# Patient Record
Sex: Male | Born: 1950 | Race: White | Hispanic: No | Marital: Married | Smoking: Never smoker
Health system: Southern US, Community
[De-identification: ages and names within clinical notes are randomized; demographics above are authoritative.]

## PROBLEM LIST (undated history)

## (undated) DIAGNOSIS — E7800 Pure hypercholesterolemia, unspecified: Secondary | ICD-10-CM

## (undated) DIAGNOSIS — Z86010 Personal history of colonic polyps: Secondary | ICD-10-CM

## (undated) DIAGNOSIS — Z800 Family history of malignant neoplasm of digestive organs: Secondary | ICD-10-CM

## (undated) DIAGNOSIS — K449 Diaphragmatic hernia without obstruction or gangrene: Secondary | ICD-10-CM

## (undated) HISTORY — PX: FOOT SURGERY: SHX648

## (undated) HISTORY — DX: Diaphragmatic hernia without obstruction or gangrene: K44.9

## (undated) HISTORY — DX: Pure hypercholesterolemia, unspecified: E78.00

## (undated) HISTORY — DX: Family history of malignant neoplasm of digestive organs: Z80.0

## (undated) HISTORY — DX: Personal history of colonic polyps: Z86.010

---

## 1983-04-25 HISTORY — PX: BACK SURGERY: SHX140

## 2004-01-16 ENCOUNTER — Emergency Department (HOSPITAL_COMMUNITY): Admission: EM | Admit: 2004-01-16 | Discharge: 2004-01-16 | Payer: Self-pay | Admitting: Emergency Medicine

## 2004-05-19 ENCOUNTER — Ambulatory Visit: Payer: Self-pay | Admitting: Gastroenterology

## 2004-05-20 ENCOUNTER — Ambulatory Visit: Payer: Self-pay | Admitting: Gastroenterology

## 2004-10-11 ENCOUNTER — Ambulatory Visit (HOSPITAL_BASED_OUTPATIENT_CLINIC_OR_DEPARTMENT_OTHER): Admission: RE | Admit: 2004-10-11 | Discharge: 2004-10-11 | Payer: Self-pay | Admitting: Family Medicine

## 2004-10-16 ENCOUNTER — Ambulatory Visit: Payer: Self-pay | Admitting: Internal Medicine

## 2006-04-25 IMAGING — CR DG FINGER MIDDLE 2+V*L*
1 series · 2 of 2 positions shown · non-contrast
Comparison: none

CLINICAL DATA: Cut third finger with Alessandro clippers.
 LEFT THIRD FINGER
 Three views of the left third finger were obtained.  There is soft tissue defect distally.  There is slight irregularity to the cortex of the tuft of the distal phalanx, most consistent with nondisplaced fracture.
 IMPRESSION
 Probable nondisplaced fracture of the tuft of the distal phalanx of the left third digit.

[Series 1001: view not recorded · 0.10mm/px · 2 of 2 slices shown]
[im 1/2]
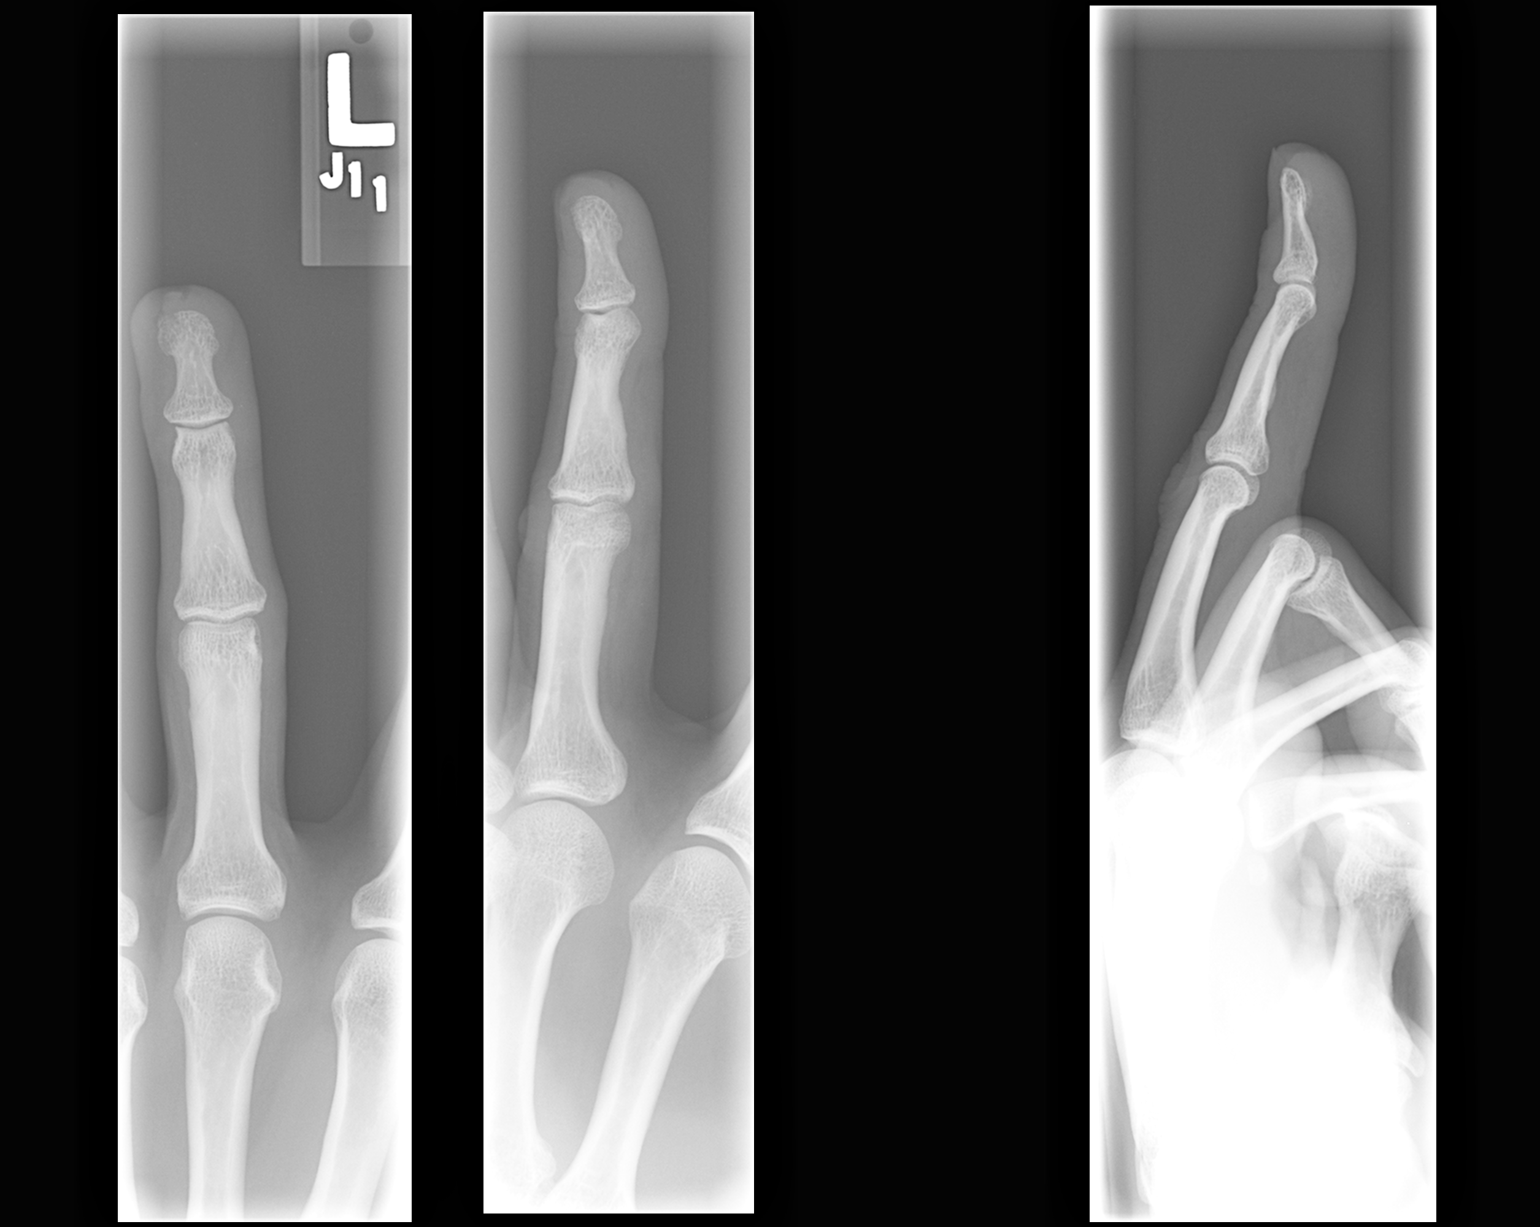
[im 2/2]
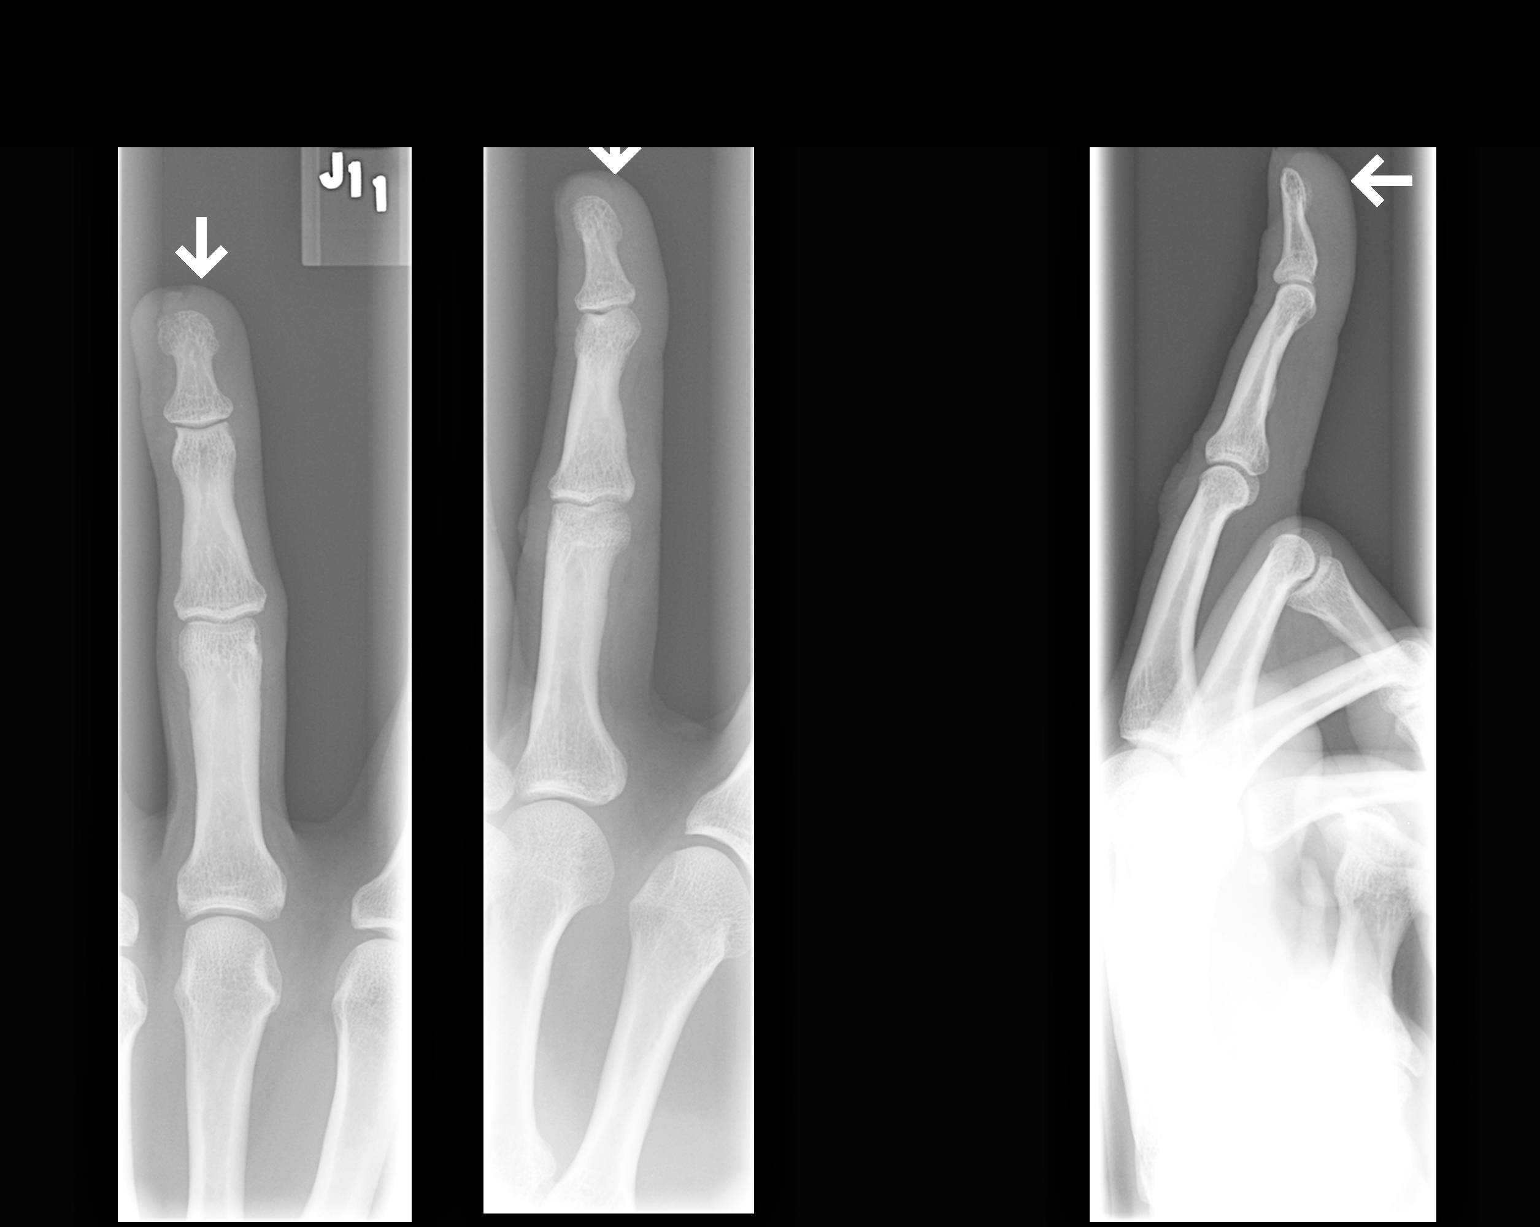

[2 of 2 positions shown; findings below may reference images not displayed]

## 2009-04-30 ENCOUNTER — Encounter (INDEPENDENT_AMBULATORY_CARE_PROVIDER_SITE_OTHER): Payer: Self-pay | Admitting: *Deleted

## 2010-05-25 NOTE — Letter (Signed)
Summary: Colonoscopy Date Change Letter  [facility]  [address] [place], [place] [address]   Phone: [phone]  Fax: [phone]      April 30, 2009 MRN: [id number]   Steven Weaver [address] [place], [place]  [address]   Dear [name],   Previously you were recommended to have a repeat colonoscopy around this time. Your chart was recently reviewed by Dr. Vania Rea. Jarold Motto of [facility]. Follow up colonoscopy is now recommended in February 2016. This revised recommendation is based on current, nationally recognized guidelines for colorectal cancer screening and polyp surveillance. These guidelines are endorsed by the American Cancer Society, [employer] on Colorectal Cancer as well as numerous other major medical organizations.  Please understand that our recommendation assumes that you do not have any new symptoms such as bleeding, a change in bowel habits, anemia, or significant abdominal discomfort. If you do have any concerning GI symptoms or want to discuss the guideline recommendations, please call to arrange an office visit at your earliest convenience. Otherwise we will keep you in our reminder system and contact you 1-2 months prior to the date listed above to schedule your next colonoscopy.  Thank you,  Vania Rea. Jarold Motto, M.D.  [facility] [phone]

## 2011-02-07 ENCOUNTER — Telehealth: Payer: Self-pay | Admitting: Gastroenterology

## 2011-02-07 NOTE — Telephone Encounter (Signed)
lmom for pt to call back

## 2011-02-08 NOTE — Telephone Encounter (Signed)
Spoke with pt's wife to inform her per Dr Norval Gable letter on 04/30/2009, stating his chart has been reviewed and he doesn't need a COLON until 05/2014 unless he has problems. Wife reports pt's mom was diagnosed 2 years ago with Colon Cancer and her repeat COLON this year had more cancerous polyps. Pt himself has become more constipated this year and his diet has remained the same. Pt takes no meds except for OTC vitamins, however he does have sleep apnea and uses CPAP at night. Pt had a direct colon and has never seen Dr Jarold Motto in the ofc. Will send for chart. Dr Jarold Motto, does the new family hx and constipation justify a Direct Colon now? Thanks.

## 2011-02-09 ENCOUNTER — Telehealth: Payer: Self-pay | Admitting: *Deleted

## 2011-02-09 ENCOUNTER — Other Ambulatory Visit: Payer: Self-pay | Admitting: Gastroenterology

## 2011-02-09 DIAGNOSIS — Z800 Family history of malignant neoplasm of digestive organs: Secondary | ICD-10-CM

## 2011-02-09 NOTE — Telephone Encounter (Signed)
Spoke with pt's wife and pt will come for PV on 02/15/11 at 3:30pm and for Direct ECL with Propofol on 02/20/11 at 3pm; wife stated understanding.

## 2011-02-15 ENCOUNTER — Ambulatory Visit (AMBULATORY_SURGERY_CENTER): Payer: BC Managed Care – PPO | Admitting: *Deleted

## 2011-02-15 DIAGNOSIS — Z800 Family history of malignant neoplasm of digestive organs: Secondary | ICD-10-CM

## 2011-02-15 DIAGNOSIS — K5900 Constipation, unspecified: Secondary | ICD-10-CM

## 2011-02-15 MED ORDER — PEG-KCL-NACL-NASULF-NA ASC-C 100 G PO SOLR: 1.0000 | Freq: Once | ORAL | Status: DC

## 2011-02-20 ENCOUNTER — Other Ambulatory Visit: Payer: Self-pay | Admitting: Gastroenterology

## 2011-02-20 ENCOUNTER — Encounter: Payer: Self-pay | Admitting: Gastroenterology

## 2011-02-20 ENCOUNTER — Ambulatory Visit (AMBULATORY_SURGERY_CENTER): Payer: BC Managed Care – PPO | Admitting: Gastroenterology

## 2011-02-20 VITALS — BP 112/75 | HR 65 | Temp 97.5°F | Resp 20 | Ht 72.0 in | Wt 205.0 lb

## 2011-02-20 DIAGNOSIS — Z860100 Personal history of colon polyps, unspecified: Secondary | ICD-10-CM

## 2011-02-20 DIAGNOSIS — K209 Esophagitis, unspecified: Secondary | ICD-10-CM

## 2011-02-20 DIAGNOSIS — K219 Gastro-esophageal reflux disease without esophagitis: Secondary | ICD-10-CM

## 2011-02-20 DIAGNOSIS — Z1211 Encounter for screening for malignant neoplasm of colon: Secondary | ICD-10-CM

## 2011-02-20 DIAGNOSIS — D126 Benign neoplasm of colon, unspecified: Secondary | ICD-10-CM

## 2011-02-20 DIAGNOSIS — K5900 Constipation, unspecified: Secondary | ICD-10-CM

## 2011-02-20 DIAGNOSIS — Z800 Family history of malignant neoplasm of digestive organs: Secondary | ICD-10-CM

## 2011-02-20 HISTORY — DX: Personal history of colonic polyps: Z86.010

## 2011-02-20 HISTORY — DX: Personal history of colon polyps, unspecified: Z86.0100

## 2011-02-20 MED ORDER — OMEPRAZOLE 40 MG PO CPDR: 40.0000 mg | DELAYED_RELEASE_CAPSULE | Freq: Every day | ORAL | Status: DC

## 2011-02-20 MED ORDER — SODIUM CHLORIDE 0.9 % IV SOLN: 500.0000 mL | INTRAVENOUS | Status: DC

## 2011-02-20 NOTE — Patient Instructions (Signed)
Please refer to your blue and neon green sheets for instructions regarding diet and activity for the rest of today.  You may resume your medications as you would normally take them.   Colon Polyps A polyp is extra tissue that grows inside your body. Colon polyps grow in the large intestine. The large intestine, also called the colon, is part of your digestive system. It is a long, hollow tube at the end of your digestive tract where your body makes and stores stool. Most polyps are not dangerous. They are benign. This means they are not cancerous. But over time, some types of polyps can turn into cancer. Polyps that are smaller than a pea are usually not harmful. But larger polyps could someday become or may already be cancerous. To be safe, doctors remove all polyps and test them.  WHO GETS POLYPS? Anyone can get polyps, but certain people are more likely than others. You may have a greater chance of getting polyps if:  You are over 50.   You have had polyps before.   Someone in your family has had polyps.   Someone in your family has had cancer of the large intestine.   Find out if someone in your family has had polyps. You may also be more likely to get polyps if you:   Eat a lot of fatty foods.   Smoke.   Drink alcohol.   Do not exercise.   Eat too much.  SYMPTOMS  Most small polyps do not cause symptoms. People often do not know they have one until their caregiver finds it during a regular checkup or while testing them for something else. Some people do have symptoms like these:  Bleeding from the anus. You might notice blood on your underwear or on toilet paper after you have had a bowel movement.   Constipation or diarrhea that lasts more than a week.   Blood in the stool. Blood can make stool look black or it can show up as red streaks in the stool.  If you have any of these symptoms, see your caregiver. HOW DOES THE DOCTOR TEST FOR POLYPS? The doctor can use four  tests to check for polyps:  Digital rectal exam. The caregiver wears gloves and checks your rectum (the last part of the large intestine) to see if it feels normal. This test would find polyps only in the rectum. Your caregiver may need to do one of the other tests listed below to find polyps higher up in the intestine.   Barium enema. The caregiver puts a liquid called barium into your rectum before taking x-rays of your large intestine. Barium makes your intestine look white in the pictures. Polyps are dark, so they are easy to see.   Sigmoidoscopy. With this test, the caregiver can see inside your large intestine. A thin flexible tube is placed into your rectum. The device is called a sigmoidoscope, which has a light and a tiny video camera in it. The caregiver uses the sigmoidoscope to look at the last third of your large intestine.   Colonoscopy. This test is like sigmoidoscopy, but the caregiver looks at all of the large intestine. It usually requires sedation. This is the most common method for finding and removing polyps.  TREATMENT   The caregiver will remove the polyp during sigmoidoscopy or colonoscopy. The polyp is then tested for cancer.   If you have had polyps, your caregiver may want you to get tested regularly in the future.    PREVENTION  There is not one sure way to prevent polyps. You might be able to lower your risk of getting them if you:  Eat more fruits and vegetables and less fatty food.   Do not smoke.   Avoid alcohol.   Exercise every day.   Lose weight if you are overweight.   Eating more calcium and folate can also lower your risk of getting polyps. Some foods that are rich in calcium are milk, cheese, and broccoli. Some foods that are rich in folate are chickpeas, kidney beans, and spinach.   Aspirin might help prevent polyps. Studies are under way.  Document Released: 01/05/2004 Document Revised: 12/21/2010 Document Reviewed: 06/12/2007 [employer] Patient  Information 2012 [employer]. 

## 2011-02-21 ENCOUNTER — Telehealth: Payer: Self-pay | Admitting: *Deleted

## 2011-02-21 NOTE — Telephone Encounter (Signed)
Left message, number identified on machine

## 2011-02-24 ENCOUNTER — Encounter: Payer: Self-pay | Admitting: Gastroenterology

## 2011-03-20 ENCOUNTER — Encounter: Payer: Self-pay | Admitting: *Deleted

## 2011-03-21 ENCOUNTER — Ambulatory Visit: Payer: BC Managed Care – PPO | Admitting: Physical Therapy

## 2011-03-23 ENCOUNTER — Encounter: Payer: Self-pay | Admitting: Gastroenterology

## 2011-03-23 ENCOUNTER — Ambulatory Visit (INDEPENDENT_AMBULATORY_CARE_PROVIDER_SITE_OTHER): Payer: BC Managed Care – PPO | Admitting: Gastroenterology

## 2011-03-23 VITALS — BP 112/70 | HR 60 | Ht 72.0 in | Wt 213.0 lb

## 2011-03-23 DIAGNOSIS — D126 Benign neoplasm of colon, unspecified: Secondary | ICD-10-CM | POA: Insufficient documentation

## 2011-03-23 DIAGNOSIS — K5730 Diverticulosis of large intestine without perforation or abscess without bleeding: Secondary | ICD-10-CM

## 2011-03-23 DIAGNOSIS — K219 Gastro-esophageal reflux disease without esophagitis: Secondary | ICD-10-CM

## 2011-03-23 NOTE — Progress Notes (Signed)
Addended by: Harlow Mares D on: 03/23/2011 08:58 AM   Modules accepted: Orders

## 2011-03-23 NOTE — Progress Notes (Signed)
History of Present Illness: This is a very pleasant 60 year old Caucasian male who returns for followup after his endoscopy and colonoscopy one month ago. Colonoscopy showed mild diverticulosis, and a small adenoma that was excised. He denies lower gastrointestinal issues at this time. He has chronic GERD and endoscopy showed a small hiatal hernia, biopsies did not show Barrett's mucosa. He currently is asymptomatic with medical management of his acid reflux, and has not started Nexium. Patient otherwise is in good health and is followed by [facility], Dr. Arlyce Dice. Patient reports that labs have all been unremarkable.    Current Medications, Allergies, Past Medical History, Past Surgical History, Family History and Social History were reviewed in [employer] record.   Assessment and plan: The patient viewed our  Education video last reflux and its management. I think he can probably control his reflux symptoms with dietary adjustments and standard antireflux maneuvers which were reviewed. He is due for colonoscopy followup in 5 years time. I have reviewed diverticulosis in his management with a high fiber diet and liberal by mouth fluids. He will see [place] on when necessary basis as needed.  Please copy her primary care physician, referring physician, and pertinent subspecialists. No diagnosis found.

## 2011-03-23 NOTE — Patient Instructions (Signed)
Today you watched the [place] movie and were given handout on gerd.  Continue taking Omeprazole as needed.

## 2015-03-02 ENCOUNTER — Encounter: Payer: Self-pay | Admitting: Gastroenterology

## 2015-11-29 ENCOUNTER — Encounter: Payer: Self-pay | Admitting: Internal Medicine

## 2017-12-20 ENCOUNTER — Ambulatory Visit (INDEPENDENT_AMBULATORY_CARE_PROVIDER_SITE_OTHER): Payer: Self-pay | Admitting: Orthopaedic Surgery

## 2017-12-25 ENCOUNTER — Telehealth (INDEPENDENT_AMBULATORY_CARE_PROVIDER_SITE_OTHER): Payer: Self-pay | Admitting: Orthopaedic Surgery

## 2017-12-25 NOTE — Telephone Encounter (Signed)
Returned call to patient left message for a call back  °

## 2017-12-26 ENCOUNTER — Telehealth (INDEPENDENT_AMBULATORY_CARE_PROVIDER_SITE_OTHER): Payer: Self-pay | Admitting: Orthopaedic Surgery

## 2017-12-26 NOTE — Telephone Encounter (Signed)
Returned call to [facility] left message to return call to reschedule appointment [phone]

## 2018-01-03 ENCOUNTER — Ambulatory Visit (INDEPENDENT_AMBULATORY_CARE_PROVIDER_SITE_OTHER): Payer: Medicare Other | Admitting: Orthopaedic Surgery

## 2018-01-28 ENCOUNTER — Ambulatory Visit (INDEPENDENT_AMBULATORY_CARE_PROVIDER_SITE_OTHER): Payer: Medicare Other

## 2018-01-28 ENCOUNTER — Ambulatory Visit (INDEPENDENT_AMBULATORY_CARE_PROVIDER_SITE_OTHER): Payer: Medicare Other | Admitting: Orthopaedic Surgery

## 2018-01-28 ENCOUNTER — Encounter (INDEPENDENT_AMBULATORY_CARE_PROVIDER_SITE_OTHER): Payer: Self-pay | Admitting: Orthopaedic Surgery

## 2018-01-28 DIAGNOSIS — M25511 Pain in right shoulder: Secondary | ICD-10-CM

## 2018-01-28 DIAGNOSIS — G8929 Other chronic pain: Secondary | ICD-10-CM

## 2018-01-28 MED ORDER — METHYLPREDNISOLONE ACETATE 40 MG/ML IJ SUSP
40.0000 mg | INTRAMUSCULAR | Status: AC | PRN
  Administered 2018-01-28: 40 mg via INTRA_ARTICULAR

## 2018-01-28 MED ORDER — LIDOCAINE HCL 1 % IJ SOLN
3.0000 mL | INTRAMUSCULAR | Status: AC | PRN
  Administered 2018-01-28: 3 mL

## 2018-01-28 NOTE — Progress Notes (Signed)
Office Visit Note   Patient: Steven Weaver           Date of Birth: [birth year]           MRN: [id number] Visit Date: 01/28/2018              Requested by: Aletha Halim., PA-C [address], [place] [address] PCP: Aletha Halim., PA-C   Assessment & Plan: Visit Diagnoses:  1. Chronic right shoulder pain     Plan: I spoke with him about trying a steroid injection this evening and his symptoms calm down and he was agreeable to this.  He tolerated it well.  I explained in detail the risk and benefits of these injections.  He will follow-up as needed since he has a travel long way but if he still continues to have right shoulder pain weakness my next step would be to obtain an MRI.  All question concerns were answered and addressed.  Follow-Up Instructions: Return if symptoms worsen or fail to improve.   Orders:  Orders Placed This Encounter  Procedures  . Large Joint Inj  . XR Shoulder Right   No orders of the defined types were placed in this encounter.     Procedures: Large Joint Inj: R subacromial bursa on 01/28/2018 9:24 AM Indications: pain and diagnostic evaluation Details: 22 G 1.5 in needle  Arthrogram: No  Medications: 3 mL lidocaine 1 %; 40 mg methylPREDNISolone acetate 40 MG/ML Outcome: tolerated well, no immediate complications Procedure, treatment alternatives, risks and benefits explained, specific risks discussed. Consent was given by the patient. Immediately prior to procedure a time out was called to verify  the correct patient, procedure, equipment, support staff and site/side marked as required. Patient was prepped and draped in the usual sterile fashion.       Clinical Data: No additional findings.   Subjective: Chief Complaint  Patient presents with  . Right Shoulder - Pain  The patient is a very pleasant 67 year old gentleman who have seen before.  Is been a long period of time since I saw him last.  He lives in [place] now is actually down this way just to be seen  by physicians.  He is right-hand dominant and has been having shoulder pain with reaching behind him and overhead activities for about a year now.  He denies any specific injury but he said he walks his dog a lot his dog up on his shoulder.  His mother passed away this year and he had to do a lot of moving of her stuff.  He says it aches and he cannot lie on the right side well.  He denies any neck pain denies any numbness tingling in his right hand.  HPI  Review of Systems He currently denies any headache, chest pain, shortness of breath, fever, chills, nausea, vomiting. Objective: Vital Signs: There were no vitals taken for this visit.  Physical Exam He is alert and oriented x3 and in no acute distress Ortho Exam Examination of his right shoulder shows some slight pain with internal rotation and adduction.  Range of motion is full but painful past 90 degrees of abduction.  He uses his deltoid slightly to the posterior shoulder.  This is on the right side.  His neck exam is normal he is had excellent motor and sensory exam in his right upper extremity.  There is slight weakness in the rotator cuff. Specialty Comments:  No specialty comments available.  Imaging: Xr Shoulder Right  Result Date: 01/28/2018 3 views of the right shoulder show well located shoulder with no acute findings.  There is no significant arthritis of the glenohumeral or the acromioclavicular joint.  Subacromial outlet has adequate  space.    PMFS History: Patient Active Problem List   Diagnosis Date Noted  . GERD (gastroesophageal reflux disease) 03/23/2011  . Benign neoplasm of colon 03/23/2011  . Diverticulosis of colon (without mention of hemorrhage) 03/23/2011   Past Medical History:  Diagnosis Date  . Family history of malignant neoplasm of gastrointestinal tract   . Hiatal hernia   . Hypercholesterolemia   . Personal history of colonic polyps 02/20/2011   tubular adenoma    Family History  Problem Relation Age of Onset  . Colon cancer Mother 3  . Esophageal cancer Father     Past Surgical History:  Procedure Laterality Date  . [place]   Disk surgery  . FOOT SURGERY     left   Social History   Occupational History    Employer: [employer]  Tobacco Use  . Smoking status: Never Smoker  . Smokeless tobacco: Never Used  Substance and Sexual Activity  . Alcohol use: Yes    Comment: Rarely  . Drug use: No  . Sexual activity: Not on file

## 2018-06-07 ENCOUNTER — Other Ambulatory Visit: Payer: Self-pay

## 2019-05-21 ENCOUNTER — Ambulatory Visit: Payer: Self-pay | Admitting: Orthopaedic Surgery

## 2019-06-11 ENCOUNTER — Ambulatory Visit: Payer: Self-pay | Admitting: Orthopaedic Surgery
